# Patient Record
Sex: Female | Born: 1950 | Race: White | Hispanic: No | Marital: Married | Smoking: Never smoker
Health system: Southern US, Community
[De-identification: ages and names within clinical notes are randomized; demographics above are authoritative.]

## PROBLEM LIST (undated history)

## (undated) HISTORY — PX: BREAST BIOPSY: SHX20

---

## 1999-09-23 ENCOUNTER — Other Ambulatory Visit: Admission: RE | Admit: 1999-09-23 | Discharge: 1999-09-23 | Payer: Self-pay | Admitting: Obstetrics & Gynecology

## 2010-06-29 ENCOUNTER — Ambulatory Visit: Payer: Self-pay | Admitting: Unknown Physician Specialty

## 2012-01-27 ENCOUNTER — Ambulatory Visit: Payer: Self-pay | Admitting: Unknown Physician Specialty

## 2014-08-05 ENCOUNTER — Ambulatory Visit: Payer: Self-pay | Admitting: Internal Medicine

## 2014-09-22 ENCOUNTER — Encounter: Payer: Self-pay | Admitting: Podiatry

## 2014-09-22 ENCOUNTER — Ambulatory Visit (INDEPENDENT_AMBULATORY_CARE_PROVIDER_SITE_OTHER): Payer: BLUE CROSS/BLUE SHIELD | Admitting: Podiatry

## 2014-09-22 ENCOUNTER — Ambulatory Visit (INDEPENDENT_AMBULATORY_CARE_PROVIDER_SITE_OTHER): Payer: BLUE CROSS/BLUE SHIELD

## 2014-09-22 VITALS — Ht 65.0 in | Wt 160.0 lb

## 2014-09-22 DIAGNOSIS — M79671 Pain in right foot: Secondary | ICD-10-CM

## 2014-09-22 DIAGNOSIS — M722 Plantar fascial fibromatosis: Secondary | ICD-10-CM | POA: Diagnosis not present

## 2014-09-22 MED ORDER — METHYLPREDNISOLONE 4 MG PO TBPK: ORAL_TABLET | ORAL | Status: DC

## 2014-09-22 MED ORDER — MELOXICAM 15 MG PO TABS: 15.0000 mg | ORAL_TABLET | Freq: Every day | ORAL | Status: DC

## 2014-09-22 MED ORDER — MELOXICAM 15 MG PO TABS: 15.0000 mg | ORAL_TABLET | Freq: Every day | ORAL | Status: AC

## 2014-09-22 MED ORDER — METHYLPREDNISOLONE 4 MG PO TBPK: ORAL_TABLET | ORAL | Status: AC

## 2014-09-23 NOTE — Progress Notes (Signed)
She presents today complaining of right dorsal and dorsolateral foot pain. States is being well for several weeks now seems to be worse when she first gets up in the morning and seems to be getting better throughout the day until she sits and returns to walking. She course of the area overlying the fourth fifth met cuboid articulation. She denies any trauma. She's done nothing to help. No changes in her past medical history medications allergies surgeries and social history.  Objective: I have reviewed her past medical history medications allergies surgeries social history and review of systems. She is pain on palpation medial calcaneal tubercle of the right heel with pain on palpation to the fourth fifth met cuboid articulation site. The graft do not demonstrate any type of osseus abnormality in this area other than soft tissue increase in density of the plantar fascia calcaneal insertion site indicative of plantar fasciitis.  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  Plan: Discussed etiology pathology conservative versus surgical therapies. Injected her right heel today dispensed a plantar fascial brace and night splint. Dispensed a prescription for Medrol Dosepak to be followed by meloxicam. We discussed the etiology pathology conservative versus surgical therapies we discussed the appropriate shoe gear stretching exercises ice therapy and shoe gear modifications. Follow-up with her in 1 month

## 2015-12-07 ENCOUNTER — Other Ambulatory Visit: Payer: Self-pay | Admitting: Internal Medicine

## 2015-12-07 DIAGNOSIS — Z1231 Encounter for screening mammogram for malignant neoplasm of breast: Secondary | ICD-10-CM

## 2016-01-14 ENCOUNTER — Ambulatory Visit
Admission: RE | Admit: 2016-01-14 | Discharge: 2016-01-14 | Disposition: A | Discharge status: Home / Self Care | Payer: BLUE CROSS/BLUE SHIELD | Source: Ambulatory Visit | Attending: Internal Medicine | Admitting: Internal Medicine

## 2016-01-14 ENCOUNTER — Other Ambulatory Visit: Payer: Self-pay | Admitting: Internal Medicine

## 2016-01-14 DIAGNOSIS — Z1231 Encounter for screening mammogram for malignant neoplasm of breast: Secondary | ICD-10-CM

## 2016-01-19 ENCOUNTER — Other Ambulatory Visit: Payer: Self-pay | Admitting: Internal Medicine

## 2016-01-19 DIAGNOSIS — N6320 Unspecified lump in the left breast, unspecified quadrant: Secondary | ICD-10-CM

## 2016-01-29 ENCOUNTER — Ambulatory Visit
Admission: RE | Admit: 2016-01-29 | Discharge: 2016-01-29 | Disposition: A | Discharge status: Home / Self Care | Payer: BLUE CROSS/BLUE SHIELD | Source: Ambulatory Visit | Attending: Internal Medicine | Admitting: Internal Medicine

## 2016-01-29 DIAGNOSIS — N6320 Unspecified lump in the left breast, unspecified quadrant: Secondary | ICD-10-CM

## 2016-01-29 DIAGNOSIS — N63 Unspecified lump in breast: Secondary | ICD-10-CM | POA: Insufficient documentation

## 2017-10-09 ENCOUNTER — Other Ambulatory Visit: Payer: Self-pay | Admitting: Internal Medicine

## 2017-10-09 DIAGNOSIS — Z1231 Encounter for screening mammogram for malignant neoplasm of breast: Secondary | ICD-10-CM

## 2017-10-26 ENCOUNTER — Ambulatory Visit
Admission: RE | Admit: 2017-10-26 | Discharge: 2017-10-26 | Disposition: A | Discharge status: Home / Self Care | Payer: Medicare Other | Source: Ambulatory Visit | Attending: Internal Medicine | Admitting: Internal Medicine

## 2017-10-26 DIAGNOSIS — Z1231 Encounter for screening mammogram for malignant neoplasm of breast: Secondary | ICD-10-CM | POA: Insufficient documentation

## 2017-11-09 ENCOUNTER — Ambulatory Visit: Payer: Medicare Other | Attending: Unknown Physician Specialty | Admitting: Speech Pathology

## 2017-11-09 ENCOUNTER — Encounter: Payer: Self-pay | Admitting: Speech Pathology

## 2017-11-09 DIAGNOSIS — R490 Dysphonia: Secondary | ICD-10-CM | POA: Diagnosis present

## 2017-11-09 NOTE — Therapy (Signed)
[facility] MAIN [facility] SERVICES [address] [place], [place], [address] Phone: [phone]   Fax:  [phone]  Speech Language Pathology Evaluation  Patient Details  Name: Tarrah Furuta Dobrowolski MRN: [id number] Date of Birth: July 03, 1950 Referring Provider: Dr. Jenne Campus   Encounter Date: 11/09/2017  End of Session - 11/09/17 1502    Visit Number  1    Number of Visits  1    Date for SLP Re-Evaluation  11/09/17    SLP Start Time  1000    SLP Stop Time   1045    SLP Time Calculation (min)  45 min    Activity Tolerance  Patient tolerated treatment well       History reviewed. No pertinent past medical history.  Past Surgical History:  Procedure Laterality Date   BREAST BIOPSY Right    in 4th grade, benign    There were no vitals filed for this visit.  Subjective Assessment - 11/09/17 1500    Subjective  Pt. reports  that her current hoarseness is not like the hoarseness she experiences when she has an upper repertory infection.     Currently in Pain?  No/denies    Multiple Pain Sites  No         SLP Evaluation [facility] - 11/09/17 0001      SLP Visit Information   SLP Received On  11/09/17    Referring Provider  Dr. Jenne Campus    Medical Diagnosis  Mild Vocal Cord Bowing      Subjective   Subjective  Her current hoarseness is not like the hoarseness she experiences when she has an upper repertory infection.     Patient/Family Stated Goal  For her voice to not sound as hoarse.      General Information   HPI  This 67 year old woman was referred by Dr. Jenne Campus due to hoarseness. Per report "laryngeal exam reveals no tumor or mass and mild vocal bowing."       Prior Functional Status   Cognitive/Linguistic Baseline  Within functional limits      Cognition   Overall Cognitive Status  Within Functional Limits for tasks assessed      Auditory Comprehension   Overall Auditory Comprehension  Appears within functional limits for tasks  assessed      Verbal Expression   Overall Verbal Expression  Appears within functional limits for tasks assessed      Oral Motor/Sensory Function   Overall Oral Motor/Sensory Function  Appears within functional limits for tasks assessed      Motor Speech   Overall Motor Speech  Appears within functional limits for tasks assessed    Respiration  Within functional limits    Phonation  Hoarse    Resonance  Within functional limits    Articulation  Within functional limitis    Intelligibility  Intelligible    Motor Planning  Witnin functional limits    Motor Speech Errors  Not applicable    Phonation  Impaired    Vocal Abuses  Prolonged Vocal Use    Volume  Appropriate    Pitch  Low      Standardized Assessments   Standardized Assessments   Other Assessment Perceptual Voice Evaluation       Perceptual Voice Evaluation Voice checklist:  Health risks: GERD, chronic sinus infections, previous smoker   Characteristic voice use: pt. 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   Environmental risks: no significant environmental risks  Misuse: excessive talking/singing, speaking without adequate breath support  Abuse: speaking in large groups without amplification, using voice during sickness and infection   Vocal characteristics: hoarse, strained Patient quality of life survey: VHI-10: 2 (A score of 10 or higher indicate voice handicap) Maximum phonation time for sustained ah: 8 seconds Average fundamental frequency during sustained ah: 213 Hz Average fundamental frequency during sustained ah after education: 248 Hz Habitual Pitch: 194 Hz Highest dynamic pitch when altering pitch from a low note to a high note: 374 Hz Lowest dynamic pitch when altering from a high note to a low note: 90 Hz Highest dynamic pitch in conversational speech: 350 Hz Lowest dynamic pitch in conversational speech: 90 Hz Average time  patient was able to sustain /s/: 11 seconds Average time patient was able to sustain /z/: 10 seconds s/z ratio : 1.1  Visi-Pitch: Multi-Dimensional Voice Program (MDVP) MDVP extracts objective quantitative values (Relative Average Perturbation, Shimmer, Voice Turbulence Index, and Noise to Harmonic Ratio) on sustained phonation, which are displayed graphically and numerically in comparison to a built-in normative database.  The patient exhibited values within the norm for these measures. Average fundamental frequency (213 Hz) was one standard deviation below the norm for her age and gender. When prompted to be louder, she was able to increase her fundamental frequency (248 Hz) while maintaining good voice quality.        SLP Education - 11/09/17 1501    Education Details  Patient instructed in voice building exercises and straw phonation to address her bowing vocal chords.     Person(s) Educated  Patient    Methods  Explanation;Demonstration    Comprehension  Verbalized understanding;Returned demonstration           Plan - 11/09/17 1504    Clinical Impression Statement  This 67 year old woman under the care of Dr. Jenne CampusMcQueen, with vocal cord bowing, is presenting with minimal dysphonia. Pt. voice was characterized by intermittent hoarse and raspy vocal quality with intermittent glottal fry. Pt. reports that after her consult with Dr. Jenne CampusMcQueen she increases her GERD medication from one to two times a day and pt. notes significant improvement in her voice quality.  Patient demonstrated ability to increase vocal loudness while maintaining and even improving vocal quality. The patient was given written and verbal teaching regarding straw phonation and voice building program. The patient does not require direct treatment at this time but was given the SLP's card if she has any questions.      Treatment/Interventions  Patient/family education    Potential to Achieve Goals  Good    SLP Home Exercise  Plan  The patient was given written and verbal teaching regarding straw phonation and voice building program    Consulted and Agree with Plan of Care  Patient       Patient will benefit from skilled therapeutic intervention in order to improve the following deficits and impairments:   Dysphonia    Problem List There are no active problems to display for this patient.   Boneta LucksJillian S Cassiel Fernandez 11/09/2017, 3:08 PM  [facility] [address] , [place], [address] Phone: [phone]   Fax:  [phone]  Name: Tyler AasLinza Layman Kope MRN: [id number] Date of Birth: 28-Jun-1950

## 2018-06-25 IMAGING — MG MM DIGITAL SCREENING BILAT W/ TOMO W/ CAD
8 series · 8 of 24 positions shown · non-contrast
Comparison: Previous exam(s).

ACR Breast Density Category a: The breast tissue is almost entirely
fatty.

CLINICAL DATA: Screening.

EXAM:
DIGITAL SCREENING BILATERAL MAMMOGRAM WITH TOMO AND CAD

[R CC synth-2D]
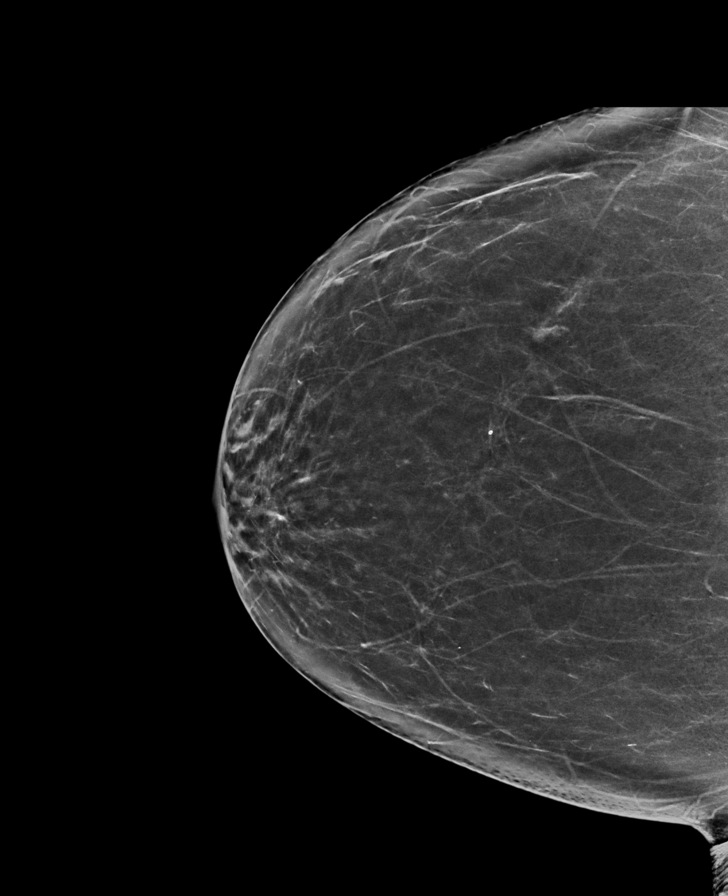

[L CC synth-2D]
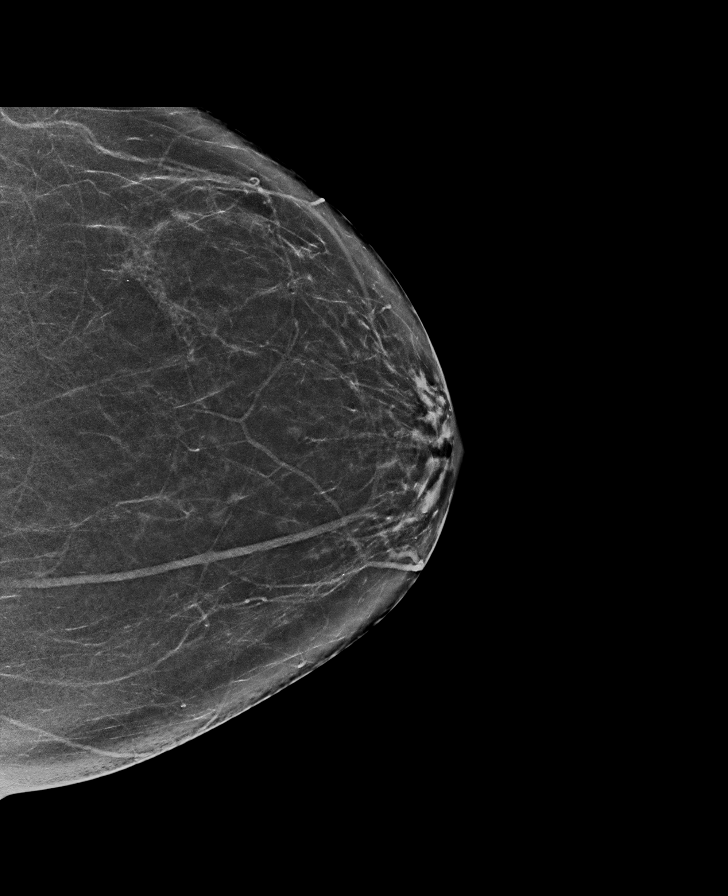

[L MLO synth-2D]
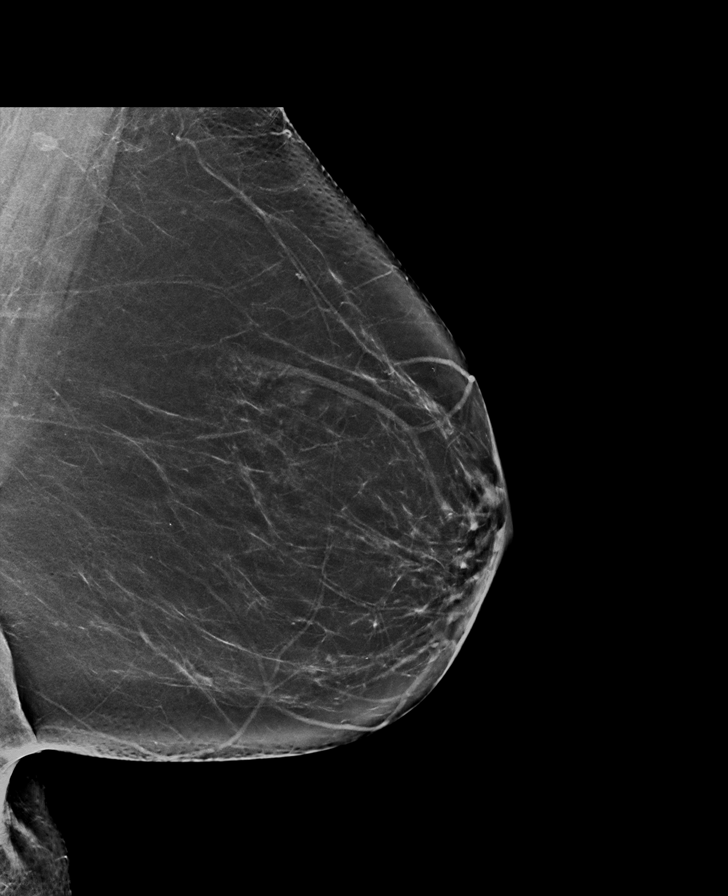

[R MLO synth-2D]
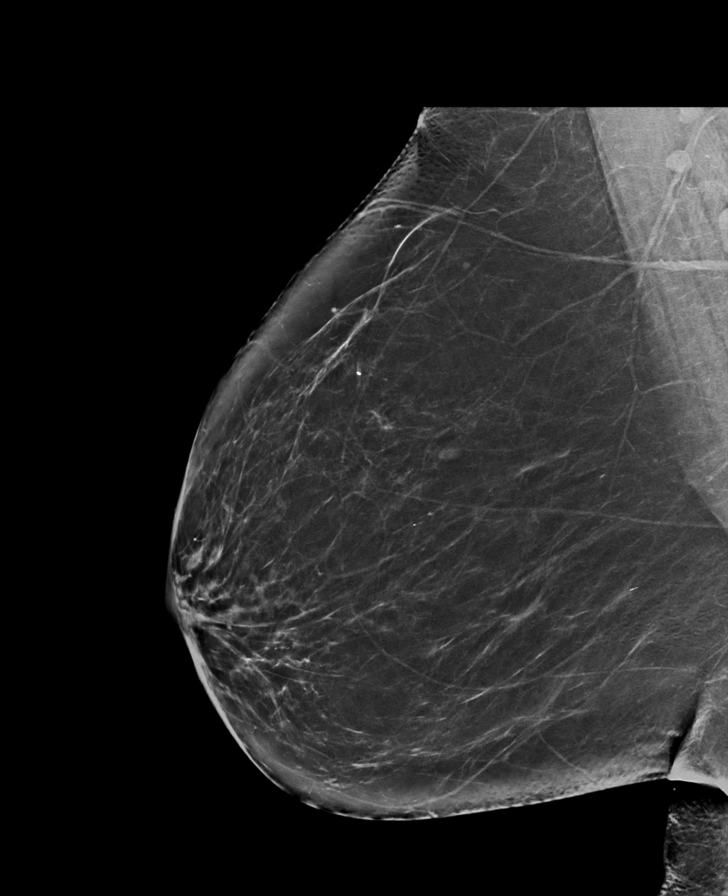

[R MLO tomo · tomo slice 41/80.0]
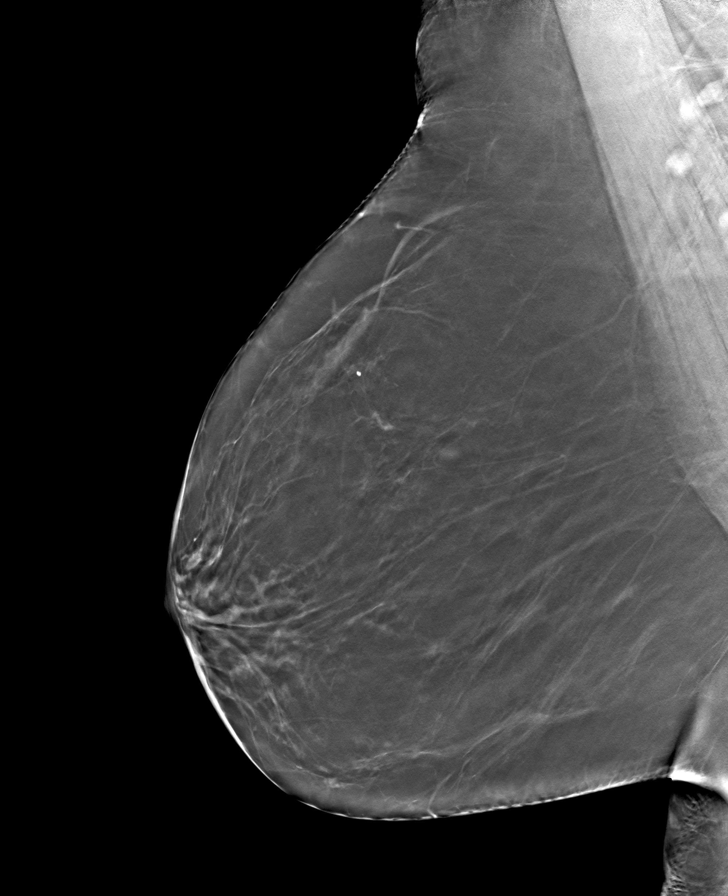

[R CC tomo · tomo slice 37/74.0]
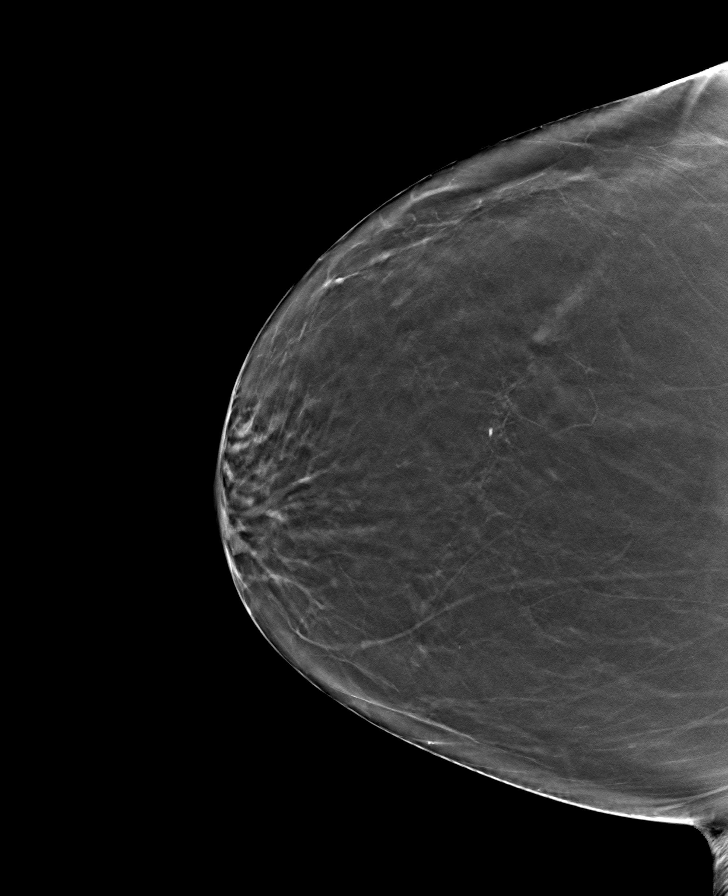

[L MLO tomo · tomo slice 39/76.0]
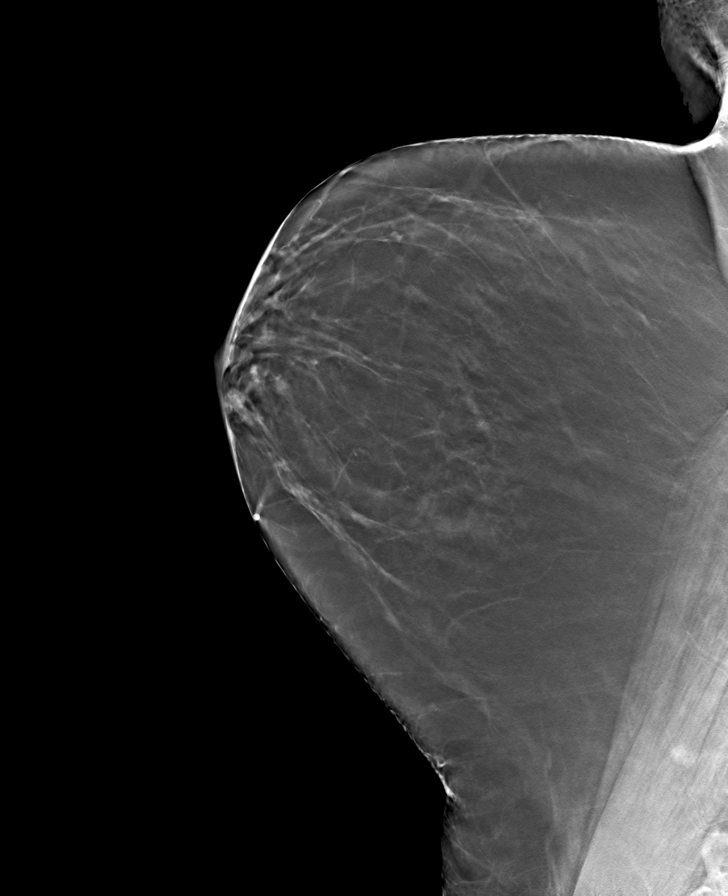

[L CC tomo · tomo slice 35/70.0]
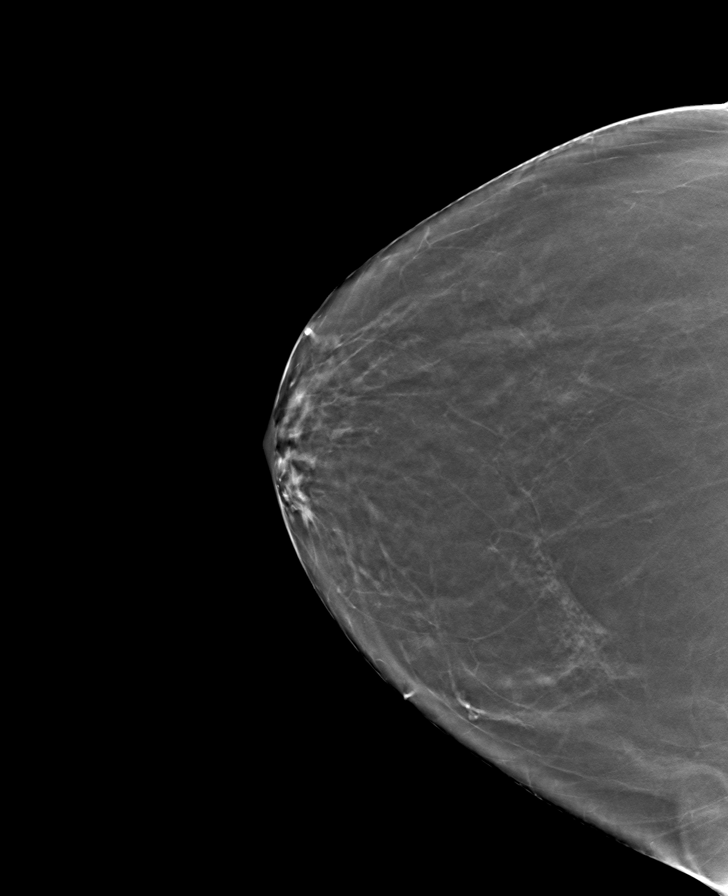

[8 of 24 positions shown; findings below may reference images not displayed]

FINDINGS: There are no findings suspicious for malignancy. Images were
processed with CAD.
IMPRESSION: No mammographic evidence of malignancy. A result letter of this
screening mammogram will be mailed directly to the patient.

RECOMMENDATION:
Screening mammogram in one year. (Code:[id number]-[id number])

BI-RADS CATEGORY  1: Negative.
